# Patient Record
Sex: Male | Born: 1950 | Race: White | Hispanic: No | Smoking: Current every day smoker
Health system: Southern US, Community
[De-identification: ages and names within clinical notes are randomized; demographics above are authoritative.]

## PROBLEM LIST (undated history)

## (undated) DIAGNOSIS — N289 Disorder of kidney and ureter, unspecified: Secondary | ICD-10-CM

## (undated) DIAGNOSIS — R000 Tachycardia, unspecified: Secondary | ICD-10-CM

---

## 2004-10-28 ENCOUNTER — Encounter: Admission: RE | Admit: 2004-10-28 | Discharge: 2004-10-28 | Payer: Self-pay | Admitting: Internal Medicine

## 2014-11-07 ENCOUNTER — Encounter: Payer: Self-pay | Admitting: Emergency Medicine

## 2014-11-07 ENCOUNTER — Emergency Department: Payer: BLUE CROSS/BLUE SHIELD

## 2014-11-07 ENCOUNTER — Emergency Department
Admission: EM | Admit: 2014-11-07 | Discharge: 2014-11-07 | Disposition: A | Discharge status: Home / Self Care | Payer: BLUE CROSS/BLUE SHIELD | Attending: Emergency Medicine | Admitting: Emergency Medicine

## 2014-11-07 DIAGNOSIS — R52 Pain, unspecified: Secondary | ICD-10-CM

## 2014-11-07 DIAGNOSIS — R319 Hematuria, unspecified: Secondary | ICD-10-CM | POA: Diagnosis present

## 2014-11-07 DIAGNOSIS — Z720 Tobacco use: Secondary | ICD-10-CM | POA: Diagnosis not present

## 2014-11-07 DIAGNOSIS — N23 Unspecified renal colic: Secondary | ICD-10-CM | POA: Diagnosis not present

## 2014-11-07 HISTORY — DX: Disorder of kidney and ureter, unspecified: N28.9

## 2014-11-07 HISTORY — DX: Tachycardia, unspecified: R00.0

## 2014-11-07 LAB — URINALYSIS COMPLETE WITH MICROSCOPIC (ARMC ONLY)
BILIRUBIN URINE: NEGATIVE
Glucose, UA: NEGATIVE mg/dL
Ketones, ur: NEGATIVE mg/dL
LEUKOCYTES UA: NEGATIVE
NITRITE: NEGATIVE
PH: 6 (ref 5.0–8.0)
PROTEIN: NEGATIVE mg/dL
SQUAMOUS EPITHELIAL / LPF: NONE SEEN
Specific Gravity, Urine: 1 — ABNORMAL LOW (ref 1.005–1.030)

## 2014-11-07 LAB — BASIC METABOLIC PANEL
Anion gap: 7 (ref 5–15)
BUN: 13 mg/dL (ref 6–20)
CO2: 26 mmol/L (ref 22–32)
Calcium: 9.3 mg/dL (ref 8.9–10.3)
Chloride: 101 mmol/L (ref 101–111)
Creatinine, Ser: 0.84 mg/dL (ref 0.61–1.24)
GFR calc Af Amer: >60 mL/min (ref >60)
GFR calc non Af Amer: >60 mL/min (ref >60)
Glucose, Bld: 100 mg/dL — ABNORMAL HIGH (ref 65–99)
Potassium: 4.2 mmol/L (ref 3.5–5.1)
Sodium: 134 mmol/L — ABNORMAL LOW (ref 135–145)

## 2014-11-07 LAB — CBC WITH DIFFERENTIAL/PLATELET
BASOS ABS: 0 10*3/uL (ref 0–0.1)
BASOS PCT: 1 %
EOS ABS: 0.1 10*3/uL (ref 0–0.7)
Eosinophils Relative: 1 %
HEMATOCRIT: 50 % (ref 40.0–52.0)
HEMOGLOBIN: 16.8 g/dL (ref 13.0–18.0)
Lymphocytes Relative: 31 %
Lymphs Abs: 2.4 10*3/uL (ref 1.0–3.6)
MCH: 31.8 pg (ref 26.0–34.0)
MCHC: 33.6 g/dL (ref 32.0–36.0)
MCV: 94.7 fL (ref 80.0–100.0)
MONOS PCT: 11 %
Monocytes Absolute: 0.8 10*3/uL (ref 0.2–1.0)
NEUTROS ABS: 4.3 10*3/uL (ref 1.4–6.5)
NEUTROS PCT: 56 %
Platelets: 201 10*3/uL (ref 150–440)
RBC: 5.28 MIL/uL (ref 4.40–5.90)
RDW: 13.3 % (ref 11.5–14.5)
WBC: 7.8 10*3/uL (ref 3.8–10.6)

## 2014-11-07 NOTE — ED Provider Notes (Signed)
[facility] Emergency Department Provider Note  ____________________________________________  Time seen: Approximately 2:06 PM  I have reviewed the triage vital signs and the nursing notes.   HISTORY  Chief Complaint Hematuria    HPI Frederick Watts is a 64 y.o. 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 This morning he had some hematuria. He drank a lot of water did not have any pain with it and went back to working and then developed some left lower quadrant pain this got worse and worse he went back home and laid down and rested the pain went away he has no more pain at this time he does have continuing hematuria however. She reports she had something similar 12-15 years ago and had a kidney stone. But he had a lot more pain with a kidney stone at that time. The pain appeared to get better with resting. He has no nausea vomiting no diarrhea no other complaints.   Past Medical History  Diagnosis Date  . Renal disorder   . Tachycardia    patient has no history of high blood pressure  There are no active problems to display for this patient.   History reviewed. No pertinent past surgical history.  No current outpatient prescriptions on file.  Allergies Review of patient's allergies indicates no known allergies.  History reviewed. No pertinent family history.  Social History Social History  Substance Use Topics  . Smoking status: Current Every Day Smoker  . Smokeless tobacco: None  . Alcohol Use: Yes    Review of Systems Constitutional: No fever/chills Eyes: No visual changes. ENT: No sore throat. Cardiovascular: Denies chest pain. Respiratory: Denies shortness of breath. Gastrointestinal: No abdominal pain.  No nausea, no vomiting.  No diarrhea.  No constipation. Genitourinary: Negative for dysuria. Musculoskeletal: Negative for back pain. Skin: Negative for rash. Neurological:  Negative for headaches, focal weakness or numbness.  10-point ROS otherwise negative.  ____________________________________________   PHYSICAL EXAM:  VITAL SIGNS: ED Triage Vitals  Enc Vitals Group     BP 11/07/14 1236 174/88 mmHg     Pulse Rate 11/07/14 1236 66     Resp 11/07/14 1236 20     Temp 11/07/14 1236 98.4 F (36.9 C)     Temp Source 11/07/14 1236 Oral     SpO2 11/07/14 1236 100 %     Weight 11/07/14 1236 130 lb (58.968 kg)     Height 11/07/14 1236  (1.575 m)     Head Cir --      Peak Flow --      Pain Score 11/07/14 1237 1     Pain Loc --      Pain Edu? --      Excl. in GC? --     Constitutional: Alert and oriented. Well appearing and in no acute distress. Eyes: Conjunctivae are normal. PERRL. EOMI. Head: Atraumatic. Nose: No congestion/rhinnorhea. Mouth/Throat: Mucous membranes are moist.  Oropharynx non-erythematous. Neck: No stridor. Cardiovascular: Normal rate, regular rhythm. Grossly normal heart sounds.  Good peripheral circulation. Respiratory: Normal respiratory effort.  No retractions. Lungs CTAB. Gastrointestinal: Soft and nontender. No distention. No abdominal bruits. No CVA tenderness. Musculoskeletal: No lower extremity tenderness nor edema.  No joint effusions. Neurologic:  Normal speech and language. No gross focal neurologic deficits are appreciated. No gait instability. Skin:  Skin is warm, dry and intact. No rash noted. Psychiatric: Mood and affect are normal. Speech and behavior are normal.  ____________________________________________  LABS (all labs ordered are listed, but only abnormal results are displayed)  Labs Reviewed  URINALYSIS COMPLETEWITH MICROSCOPIC ([facility] ONLY) - Abnormal; Notable for the following:    Color, Urine STRAW (*)    APPearance CLEAR (*)    Specific Gravity, Urine 1.000 (*)    Hgb urine dipstick 3+ (*)    Bacteria, UA RARE (*)    All other components within normal limits  BASIC METABOLIC PANEL -  Abnormal; Notable for the following:    Sodium 134 (*)    Glucose, Bld 100 (*)    All other components within normal limits  CBC WITH DIFFERENTIAL/PLATELET   ____________________________________________  EKG  ____________________________________________  RADIOLOGY  Chest x-ray is read as consistent with COPD. CT of the abdomen and pelvis shows 1 stone in the left kidney. There is another stone in the bladder. The area is some remaining left-sided hydronephrosis consistent with the stone having passed recently. Patient also has a high density possible mass in the bladder which is reevaluated by urology here per the radiologist ____________________________________________   PROCEDURES     INITIAL IMPRESSION / ASSESSMENT AND PLAN / ED COURSE  Pertinent labs & imaging results that were available during my care of the patient were reviewed by me and considered in my medical decision making (see chart for details).  Discussed above findings with patient will refer him to urology. ____________________________________________   FINAL CLINICAL IMPRESSION(S) / ED DIAGNOSES  Final diagnoses:  Renal colic      Arnaldo Natal, MD 11/07/14 2100

## 2014-11-07 NOTE — ED Notes (Signed)
Pt states he has been having blood in his urine and back pain

## 2014-11-07 NOTE — ED Notes (Signed)
Pt to ed with c/o bloody urine today.  Pt states hx of kidney stones and reports intake of large amount of water today and now urine still red in color but not as much.  States pain in left flank area.

## 2016-07-23 IMAGING — CT CT RENAL STONE PROTOCOL
1 of 2 series · 15 of 32 positions shown, 19 images · non-contrast
Comparison: None.

CLINICAL DATA: Left flank pain and gross hematuria.

EXAM:
CT ABDOMEN AND PELVIS WITHOUT CONTRAST
TECHNIQUE: Multidetector CT imaging of the abdomen and pelvis was performed
following the standard protocol without IV contrast.

[Series 2: stone standard full · axial · 0.61mm/px · z∈[-106,+250]mm · 15 of 79 slices shown, 19 images]
[im 4/79  soft-tissue]
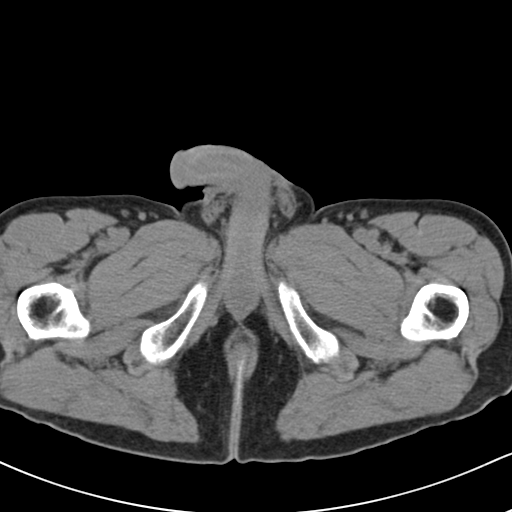
[im 4/79  bone]
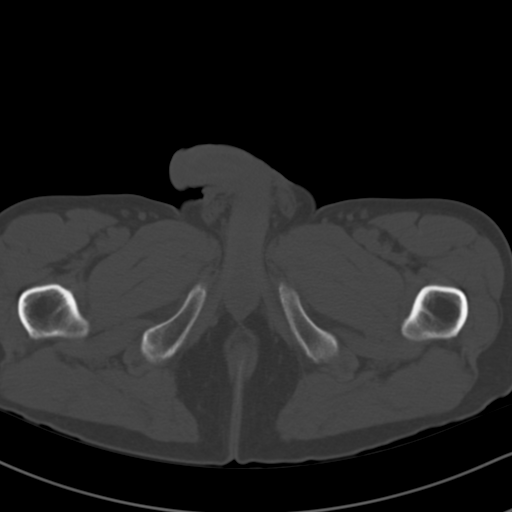
[im 10/79  soft-tissue]
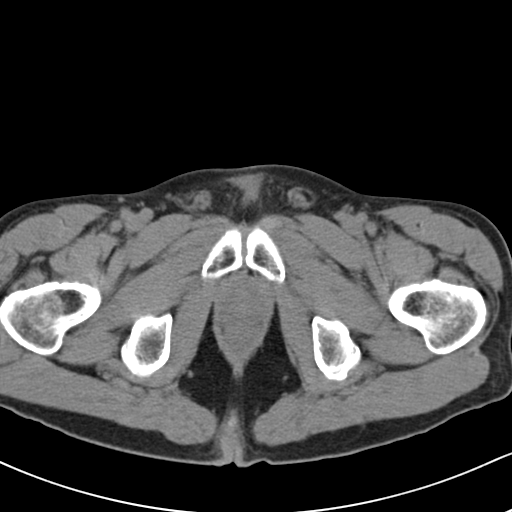
[im 17/79  soft-tissue]
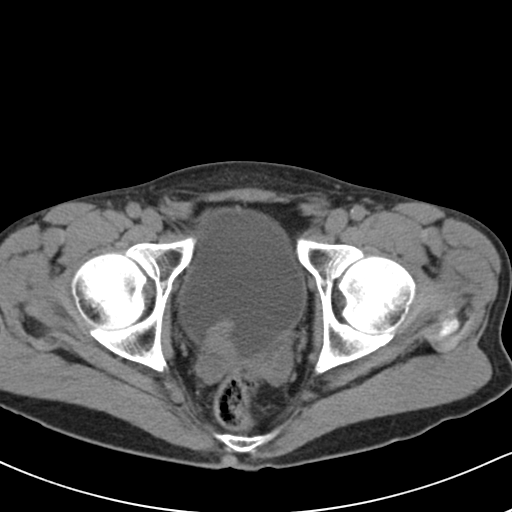
[im 23/79  soft-tissue]
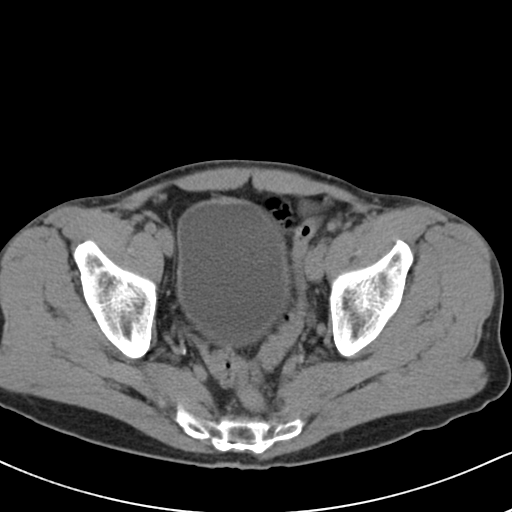
[im 27/79  soft-tissue]
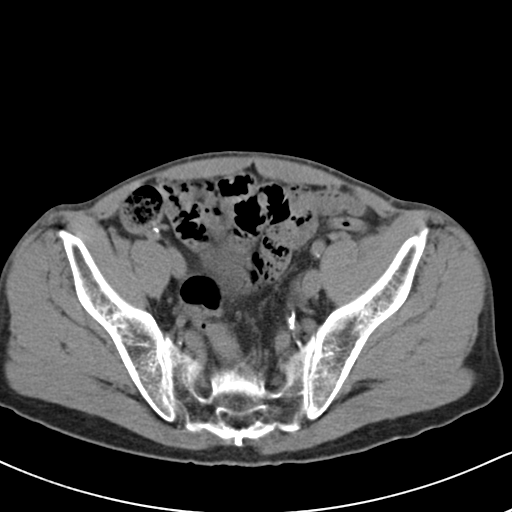
[im 33/79  soft-tissue]
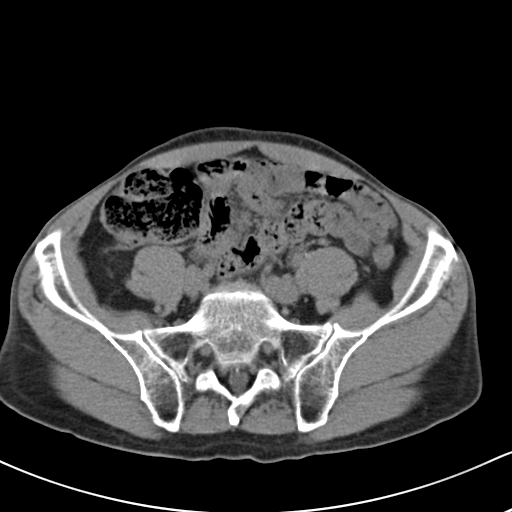
[im 40/79  soft-tissue]
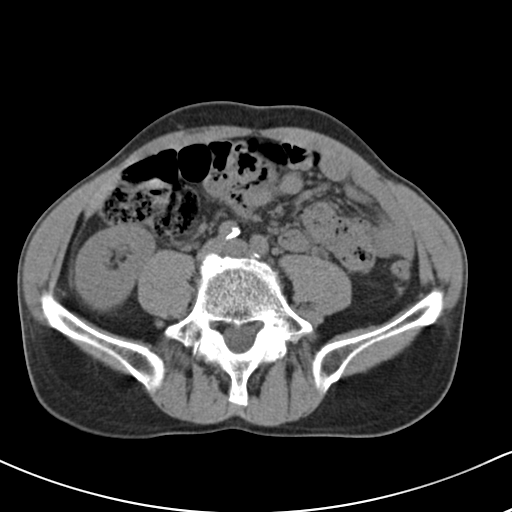
[im 46/79  soft-tissue]
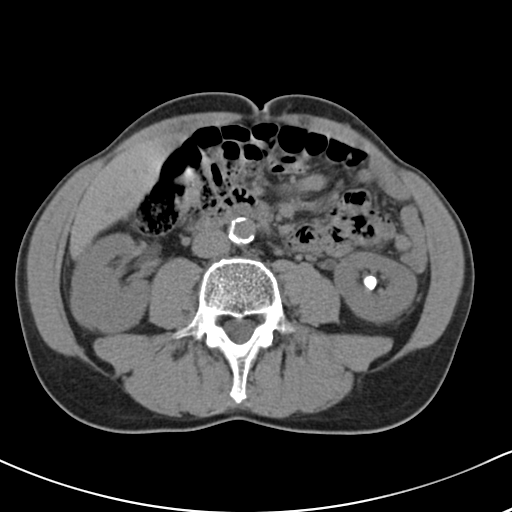
[im 53/79  soft-tissue]
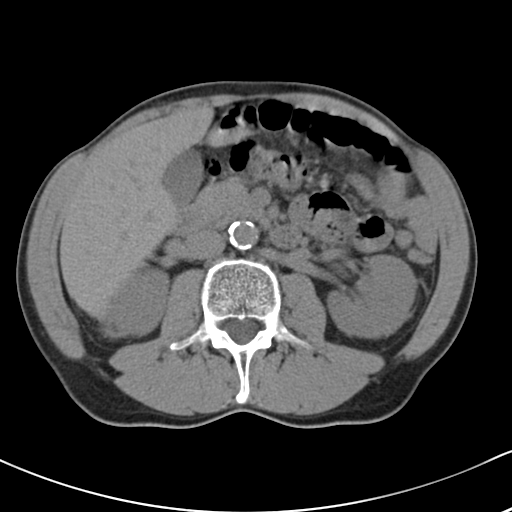
[im 53/79  bone]
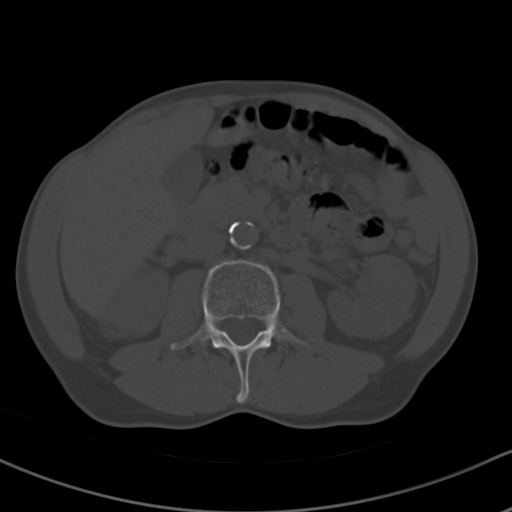
[im 56/79  soft-tissue]
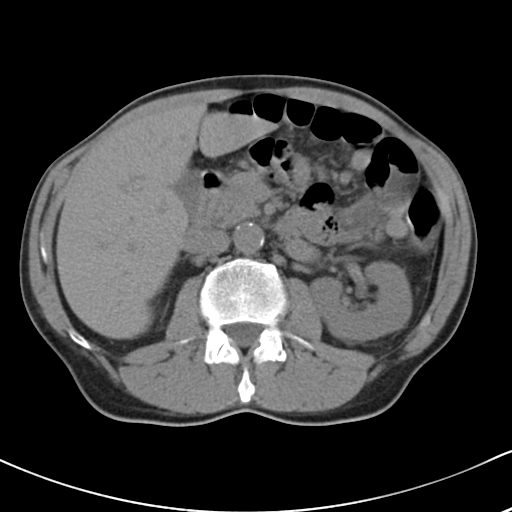
[im 62/79  soft-tissue]
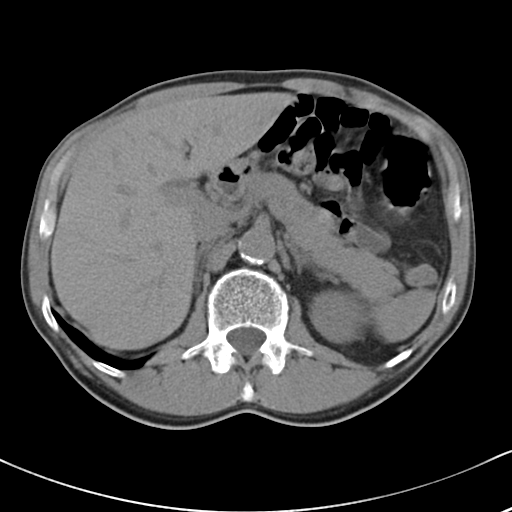
[im 66/79  lung]
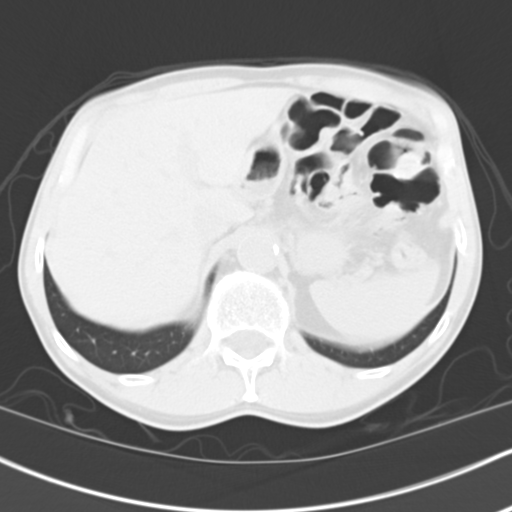
[im 69/79  soft-tissue]
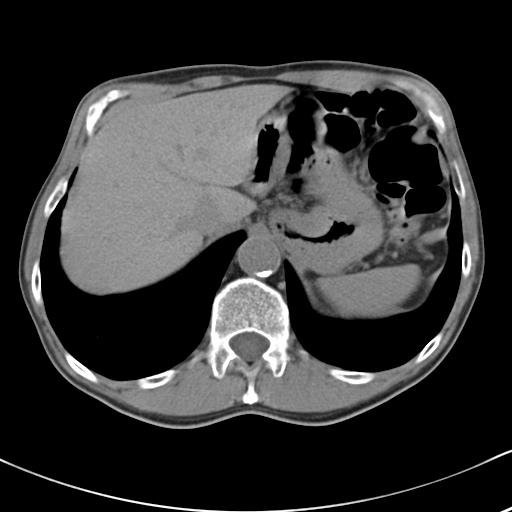
[im 69/79  lung]
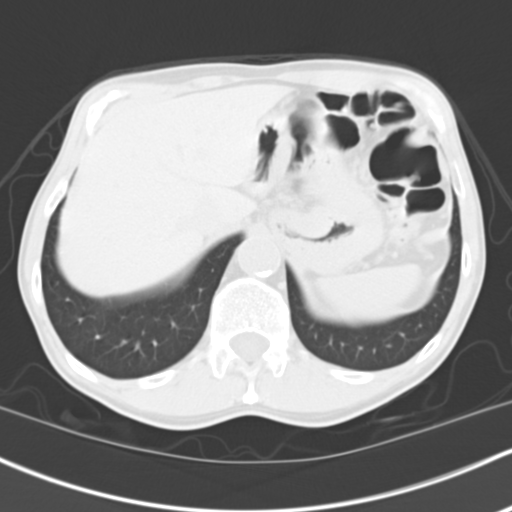
[im 72/79  lung]
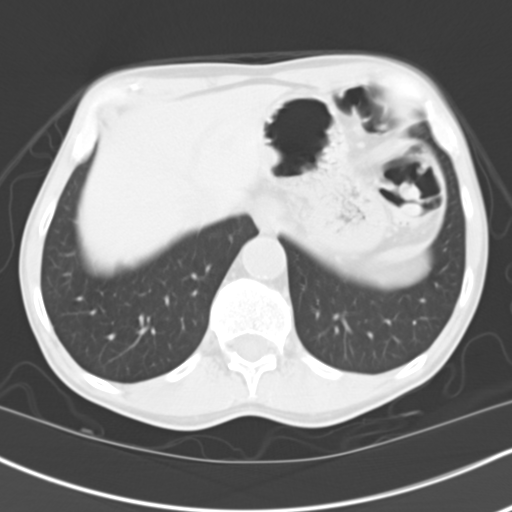
[im 75/79  soft-tissue]
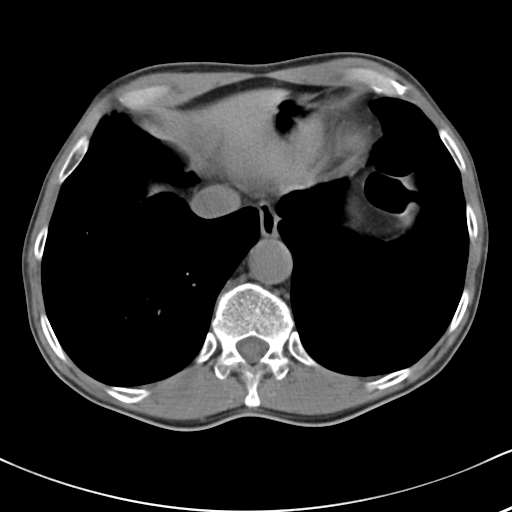
[im 75/79  lung]
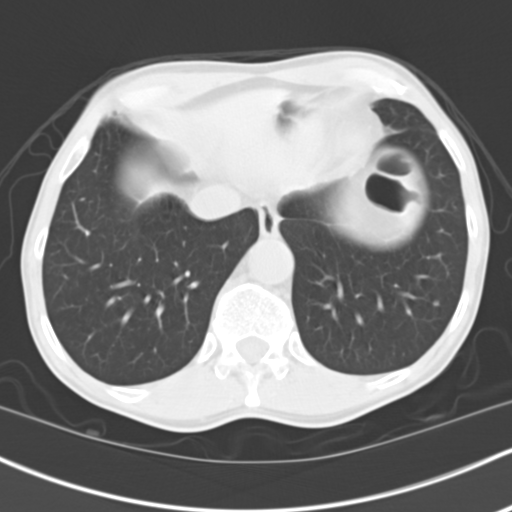

[15 of 32 positions shown; findings below may reference images not displayed]

FINDINGS: Calculus in the lower pole of the left kidney is elongated in shape
and measures approximately 0.8 x 0.8 x 1.5 cm. There is evidence of
mild left hydronephrosis mild dilatation of the proximal left
ureter. A definite ureteral calculus is not identified.

Within the bladder lumen, small focal echogenic area to the right of
midline posteriorly may represent a small passed calculus and
measures approximately 4 mm. There is polypoid high density within
the posterior bladder just to the right of midline measuring
approximately 2.1 x 2.2 x 1.7 cm. While this could represent focal
blood clot, there is some concern for potentially focal bladder mass
in this region. Urologic follow-up is recommended.

Unenhanced appearance of the liver, gallbladder, spleen, pancreas,
adrenal glands and right kidney are unremarkable. Un opacified bowel
is normal. Calcified plaque is noted of the abdominal aorta without
evidence of aneurysmal disease. No enlarged lymph nodes or hernias
are identified.
IMPRESSION: 1. Focal polypoid high density material in the posterior bladder.
While this could represent blood clot, there is some concern that
this may represent a bladder tumor. Urologic follow-up is
recommended. Adjacent small focal high density may represent a
passed calculus in the bladder lumen.
2. Suggestion of mild left hydronephrosis and left hydroureter.
Nonobstructing elongated renal calculus in the lower pole of the
left kidney measures 15 mm in greatest length.
